# Patient Record
Sex: Female | Born: 1935 | Race: Black or African American | Hispanic: No
Health system: Southern US, Community
[De-identification: ages and names within clinical notes are randomized; demographics above are authoritative.]

## PROBLEM LIST (undated history)

## (undated) DIAGNOSIS — R4701 Aphasia: Secondary | ICD-10-CM

## (undated) DIAGNOSIS — M6281 Muscle weakness (generalized): Secondary | ICD-10-CM

## (undated) DIAGNOSIS — R569 Unspecified convulsions: Secondary | ICD-10-CM

## (undated) DIAGNOSIS — E785 Hyperlipidemia, unspecified: Secondary | ICD-10-CM

## (undated) DIAGNOSIS — R293 Abnormal posture: Secondary | ICD-10-CM

## (undated) DIAGNOSIS — R6251 Failure to thrive (child): Secondary | ICD-10-CM

## (undated) DIAGNOSIS — G8190 Hemiplegia, unspecified affecting unspecified side: Secondary | ICD-10-CM

## (undated) DIAGNOSIS — F0390 Unspecified dementia without behavioral disturbance: Secondary | ICD-10-CM

## (undated) DIAGNOSIS — F32A Depression, unspecified: Secondary | ICD-10-CM

## (undated) DIAGNOSIS — R5381 Other malaise: Secondary | ICD-10-CM

## (undated) DIAGNOSIS — F329 Major depressive disorder, single episode, unspecified: Secondary | ICD-10-CM

## (undated) DIAGNOSIS — R296 Repeated falls: Secondary | ICD-10-CM

## (undated) DIAGNOSIS — R262 Difficulty in walking, not elsewhere classified: Secondary | ICD-10-CM

## (undated) DIAGNOSIS — M24529 Contracture, unspecified elbow: Secondary | ICD-10-CM

## (undated) DIAGNOSIS — N289 Disorder of kidney and ureter, unspecified: Secondary | ICD-10-CM

## (undated) DIAGNOSIS — R279 Unspecified lack of coordination: Secondary | ICD-10-CM

## (undated) DIAGNOSIS — R1310 Dysphagia, unspecified: Secondary | ICD-10-CM

## (undated) DIAGNOSIS — M1990 Unspecified osteoarthritis, unspecified site: Secondary | ICD-10-CM

## (undated) DIAGNOSIS — K5900 Constipation, unspecified: Secondary | ICD-10-CM

## (undated) DIAGNOSIS — W19XXXA Unspecified fall, initial encounter: Secondary | ICD-10-CM

## (undated) DIAGNOSIS — I10 Essential (primary) hypertension: Secondary | ICD-10-CM

---

## 2016-11-12 ENCOUNTER — Emergency Department (HOSPITAL_COMMUNITY)
Admission: EM | Admit: 2016-11-12 | Discharge: 2016-11-12 | Disposition: A | Discharge status: Skilled Nursing Facility | Payer: Medicare Other | Attending: Emergency Medicine | Admitting: Emergency Medicine

## 2016-11-12 ENCOUNTER — Emergency Department (HOSPITAL_COMMUNITY): Payer: Medicare Other

## 2016-11-12 ENCOUNTER — Encounter (HOSPITAL_COMMUNITY): Payer: Self-pay | Admitting: Emergency Medicine

## 2016-11-12 DIAGNOSIS — Z431 Encounter for attention to gastrostomy: Secondary | ICD-10-CM | POA: Insufficient documentation

## 2016-11-12 DIAGNOSIS — R633 Feeding difficulties, unspecified: Secondary | ICD-10-CM

## 2016-11-12 DIAGNOSIS — R410 Disorientation, unspecified: Secondary | ICD-10-CM | POA: Diagnosis present

## 2016-11-12 DIAGNOSIS — F0390 Unspecified dementia without behavioral disturbance: Secondary | ICD-10-CM | POA: Diagnosis not present

## 2016-11-12 DIAGNOSIS — Z4659 Encounter for fitting and adjustment of other gastrointestinal appliance and device: Secondary | ICD-10-CM

## 2016-11-12 HISTORY — DX: Unspecified fall, initial encounter: W19.XXXA

## 2016-11-12 HISTORY — DX: Essential (primary) hypertension: I10

## 2016-11-12 HISTORY — DX: Dysphagia, unspecified: R13.10

## 2016-11-12 HISTORY — DX: Hemiplegia, unspecified affecting unspecified side: G81.90

## 2016-11-12 HISTORY — DX: Disorder of kidney and ureter, unspecified: N28.9

## 2016-11-12 HISTORY — DX: Constipation, unspecified: K59.00

## 2016-11-12 HISTORY — DX: Hyperlipidemia, unspecified: E78.5

## 2016-11-12 HISTORY — DX: Other malaise: R53.81

## 2016-11-12 HISTORY — DX: Unspecified convulsions: R56.9

## 2016-11-12 HISTORY — DX: Contracture, unspecified elbow: M24.529

## 2016-11-12 HISTORY — DX: Unspecified osteoarthritis, unspecified site: M19.90

## 2016-11-12 HISTORY — DX: Abnormal posture: R29.3

## 2016-11-12 HISTORY — DX: Difficulty in walking, not elsewhere classified: R26.2

## 2016-11-12 HISTORY — DX: Muscle weakness (generalized): M62.81

## 2016-11-12 HISTORY — DX: Depression, unspecified: F32.A

## 2016-11-12 HISTORY — DX: Aphasia: R47.01

## 2016-11-12 HISTORY — DX: Failure to thrive (child): R62.51

## 2016-11-12 HISTORY — DX: Major depressive disorder, single episode, unspecified: F32.9

## 2016-11-12 HISTORY — DX: Unspecified lack of coordination: R27.9

## 2016-11-12 HISTORY — DX: Unspecified dementia, unspecified severity, without behavioral disturbance, psychotic disturbance, mood disturbance, and anxiety: F03.90

## 2016-11-12 HISTORY — PX: IR REPLC GASTRO/COLONIC TUBE PERCUT W/FLUORO: IMG2333

## 2016-11-12 HISTORY — DX: Repeated falls: R29.6

## 2016-11-12 MED ORDER — IOPAMIDOL (ISOVUE-300) INJECTION 61%
INTRAVENOUS | Status: AC
  Filled 2016-11-12: qty 50

## 2016-11-12 NOTE — ED Provider Notes (Signed)
[facility]-EMERGENCY DEPT Provider Note   CSN: [id number] Arrival date & time: 11/12/16  4098     History   Chief Complaint Chief Complaint  Patient presents with  . peg tube removed by patient    HPI Tara Humphrey is a 83 y.o. female.  The history is provided by the patient and medical records (nursing spoke to nursing home).  Wound Check  This is a new problem. The current episode started 3 to 5 hours ago. The problem occurs constantly. The problem has not changed since onset.Pertinent negatives include no chest pain, no abdominal pain, no headaches and no shortness of breath.   LVL 5 Caveat for dementia  No past medical history on file.  There are no active problems to display for this patient.   No past surgical history on file.  OB History    No data available       Home Medications    Prior to Admission medications   Not on File    Family History No family history on file.  Social History Social History  Substance Use Topics  . Smoking status: Not on file  . Smokeless tobacco: Not on file  . Alcohol use Not on file     Allergies   Patient has no allergy information on record.   Review of Systems Review of Systems  Unable to perform ROS: Dementia  Constitutional: Negative for chills and fatigue.  Respiratory: Negative for chest tightness and shortness of breath.   Cardiovascular: Negative for chest pain.  Gastrointestinal: Negative for abdominal pain, diarrhea, nausea and vomiting.  Genitourinary: Negative for dysuria and flank pain.  Musculoskeletal: Negative for back pain, neck pain and neck stiffness.  Skin: Negative for rash.  Neurological: Negative for headaches.  Psychiatric/Behavioral: Negative for agitation.     Physical Exam Updated Vital Signs BP (!) 138/125 (BP Location: Left Arm)   Pulse 76   Temp (!) 97.4 F (36.3 C) (Oral)   Resp 18   SpO2 100%   Physical Exam  Constitutional: She appears well-developed and  well-nourished. No distress.  HENT:  Mouth/Throat: Oropharynx is clear and moist. No oropharyngeal exudate.  Eyes: Conjunctivae are normal. No scleral icterus.  Patient does not open her eyes well. After being pried open, patient's right eyehad reactive pupils. Left eye difficult to view the pupil.  Cardiovascular:  No murmur heard. Pulmonary/Chest: No stridor. No respiratory distress. She has no wheezes. She has no rales. She exhibits no tenderness.  Abdominal: Soft. Bowel sounds are normal. There is no tenderness.    Musculoskeletal: She exhibits no tenderness or deformity.  Neurological: She is alert.  Skin: Capillary refill takes less than 2 seconds. No rash noted. She is not diaphoretic.  Nursing note and vitals reviewed.    ED Treatments / Results  Labs (all labs ordered are listed, but only abnormal results are displayed) Labs Reviewed - No data to display  EKG  EKG Interpretation None       Radiology Ir Replc Gastro/colonic Tube Percut W/fluoro  Result Date: 11/12/2016 CLINICAL DATA:  Dislodged gastrostomy tube. EXAM: GASTROSTOMY CATHETER REPLACEMENT WITH FLUOROSCOPY Physician: Rachelle Hora. Lowella Dandy, MD FLUOROSCOPY TIME:  12 seconds, 1 mGy MEDICATIONS: None ANESTHESIA/SEDATION: Moderate sedation time: None CONTRAST:  10 mL Isovue-300 PROCEDURE: Gastrostomy tube was completely dislodged. A 5 French catheter was advanced through the old gastrostomy site and an Amplatz wire was placed. A 14 French Entuit gastrostomy tube was advanced over the wire. The balloon was inflated with 5 cc  of saline. Contrast injection confirmed placement in the stomach. Fluoroscopic images were taken and saved for this procedure. FINDINGS: New gastrostomy tube in the stomach. COMPLICATIONS: No immediate complication IMPRESSION: Successful replacement of the gastrostomy tube. The patient now has a 30 French balloon retention gastrostomy tube. Tube is ready for use. Electronically Signed   By: Richarda Overlie M.D.    On: 11/12/2016 13:37    Procedures Procedures (including critical care time)  Medications Ordered in ED Medications - No data to display   Initial Impression / Assessment and Plan / ED Course  I have reviewed the triage vital signs and the nursing notes.  Pertinent labs & imaging results that were available during my care of the patient were reviewed by me and considered in my medical decision making (see chart for details).     Tara Humphrey is a 83 y.o. female with a past medical history significant for vascular dementia, depression, dysphagia status post G-tube, hypertension, hyperlipidemia, seizures, GERD, and prior stroke who presents with G-tube problem. According to EMS, patient G-tube fell out overnight and patient was sent from [facility] to have it replaced. Patient is disoriented and does not know why she is here. He denies any abdominal pain, nausea, vomiting, chest pain, cough, shortness of breath or any history symptoms. Patient is pleasantly confused and in no distress.  On exam, patient has a large nodule on her left neck. Documentation that arrived with patient from EMS shows that patient has an ongoing workup of her left neck lump. Patient has clear lungs on exam. Nontender chest. Nontender abdomen. Patient has a G-tube site in her upper abdomen that appears crusted but no active drainage. Physical exam otherwise showed no acute evidence of trauma or other new abnormality.  Facility will be called for more information on type of tube present.  11:28 AM Nursing spoke to the patient's facility and they reported that patient had a feeding tube but did not know the type or size. They said that when he gets pulled out the usually place "a 12 for a 14" into the site. As we do not know what type of tube or catheter was previously used, we will attempt a Foley placement to keep the tract open. It is also uncertain how long the patient has had the catheter in place. Scarring makes it  appears that it is not fresh however on my exam, it appeared very crusted over and very tight.  An attempt was made with a ize 12 Foley catheter. Another attempt was made with an in and out small catheter to try and open the site. After several attempts failure, wound will be dressed and interventional radiology will be called for assistance.   Anticipate having IR help place a catheter and patient will be discharged to follow-up with her general surgeon for further management of her feeding tube.  IR sent a provider to the bedside and was able to place a 14 G-tube into the site. Placement was confirmed with x-ray. Interventional radiology had to use a wire guided placement.  Patient tolerated procedure without difficulty. Patient will be discharged back to her facility after  successful replacement by interventional radiology team. Did not feel patient had any other symptoms requiring evaluation or management today. Patient discharged back to facility in good condition. Patient given instructions to follow up with general surgery team that placed the tube for reassessment and further management.    Final Clinical Impressions(s) / ED Diagnoses   Final diagnoses:  Encounter for feeding tube placement    New Prescriptions There are no discharge medications for this patient.   Clinical Impression: 1. Encounter for feeding tube placement   2. Feeding difficulties     Disposition: Discharge  Condition: Good  I have discussed the results, Dx and Tx plan with the pt(& family if present). He/she/they expressed understanding and agree(s) with the plan. Discharge instructions discussed at great length. Strict return precautions discussed and pt &/or family have verbalized understanding of the instructions. No further questions at time of discharge.    There are no discharge medications for this patient.   Follow Up: [facility] [address] [id number] mc [place] [address] [phone]  If symptoms worsen     Tegeler, Canary Brim, MD 11/12/16 [phone]

## 2016-11-12 NOTE — ED Notes (Signed)
Deborah from [facility] regarding pt gastrostomy tube displacement. Deborah verbalizes tube size 12 or 14 and pulled out this am. Tegeler, MD notified of the above face to face interaction.

## 2016-11-12 NOTE — ED Notes (Signed)
Bed: AT55 Expected date:  Expected time:  Means of arrival:  Comments: Ems-pulled out feeding tube

## 2016-11-12 NOTE — Discharge Instructions (Signed)
Your feeding tube was replaced by the interventional radiology team today. Please follow-up with your primary care physician and/or surgeon for further management of your feeding tube. As you have had no other complaints, we feel comfortable letting you go back to your facility with your new tube in place. Please be careful not to pull it out again. If any symptoms change or worsen, please return to the nearest emergency department.

## 2016-11-12 NOTE — ED Triage Notes (Signed)
Pt arrives to [facility] from [facility] via [facility] because she removed her peg tube around 6am today. Pt has history of dementia.

## 2016-11-12 NOTE — ED Notes (Signed)
Tegeler, MD attempt to place 14 french foley until pt able to follow up with GI; unsuccessful attempt; plan to consult to IR for gastrostomy tube placement.

## 2016-11-27 ENCOUNTER — Encounter (HOSPITAL_COMMUNITY): Payer: Self-pay | Admitting: Emergency Medicine

## 2016-11-27 ENCOUNTER — Emergency Department (HOSPITAL_COMMUNITY)
Admission: EM | Admit: 2016-11-27 | Discharge: 2016-11-27 | Disposition: A | Discharge status: Home / Self Care | Payer: Medicare Other | Attending: Emergency Medicine | Admitting: Emergency Medicine

## 2016-11-27 ENCOUNTER — Emergency Department (HOSPITAL_COMMUNITY): Payer: Medicare Other

## 2016-11-27 DIAGNOSIS — E870 Hyperosmolality and hypernatremia: Secondary | ICD-10-CM

## 2016-11-27 DIAGNOSIS — I10 Essential (primary) hypertension: Secondary | ICD-10-CM | POA: Insufficient documentation

## 2016-11-27 DIAGNOSIS — S06370A Contusion, laceration, and hemorrhage of cerebellum without loss of consciousness, initial encounter: Secondary | ICD-10-CM | POA: Diagnosis not present

## 2016-11-27 DIAGNOSIS — S0990XA Unspecified injury of head, initial encounter: Secondary | ICD-10-CM | POA: Diagnosis present

## 2016-11-27 DIAGNOSIS — Y92129 Unspecified place in nursing home as the place of occurrence of the external cause: Secondary | ICD-10-CM | POA: Insufficient documentation

## 2016-11-27 DIAGNOSIS — F0390 Unspecified dementia without behavioral disturbance: Secondary | ICD-10-CM | POA: Diagnosis not present

## 2016-11-27 DIAGNOSIS — S06339A Contusion and laceration of cerebrum, unspecified, with loss of consciousness of unspecified duration, initial encounter: Secondary | ICD-10-CM

## 2016-11-27 DIAGNOSIS — W19XXXA Unspecified fall, initial encounter: Secondary | ICD-10-CM | POA: Insufficient documentation

## 2016-11-27 DIAGNOSIS — S0633AA Contusion and laceration of cerebrum, unspecified, with loss of consciousness status unknown, initial encounter: Secondary | ICD-10-CM

## 2016-11-27 DIAGNOSIS — T148XXA Other injury of unspecified body region, initial encounter: Secondary | ICD-10-CM

## 2016-11-27 DIAGNOSIS — Y999 Unspecified external cause status: Secondary | ICD-10-CM | POA: Insufficient documentation

## 2016-11-27 DIAGNOSIS — S0083XA Contusion of other part of head, initial encounter: Secondary | ICD-10-CM | POA: Diagnosis not present

## 2016-11-27 DIAGNOSIS — Y939 Activity, unspecified: Secondary | ICD-10-CM | POA: Diagnosis not present

## 2016-11-27 LAB — CBC WITH DIFFERENTIAL/PLATELET
BASOS ABS: 0 10*3/uL (ref 0.0–0.1)
BASOS PCT: 0 %
Eosinophils Absolute: 0.1 10*3/uL (ref 0.0–0.7)
Eosinophils Relative: 1 %
HEMATOCRIT: 35.8 % — AB (ref 36.0–46.0)
HEMOGLOBIN: 11.2 g/dL — AB (ref 12.0–15.0)
Lymphocytes Relative: 17 %
Lymphs Abs: 2 10*3/uL (ref 0.7–4.0)
MCH: 27.5 pg (ref 26.0–34.0)
MCHC: 31.3 g/dL (ref 30.0–36.0)
MCV: 88 fL (ref 78.0–100.0)
MONOS PCT: 9 %
Monocytes Absolute: 1.1 10*3/uL — ABNORMAL HIGH (ref 0.1–1.0)
NEUTROS ABS: 8.6 10*3/uL — AB (ref 1.7–7.7)
NEUTROS PCT: 73 %
Platelets: 145 10*3/uL — ABNORMAL LOW (ref 150–400)
RBC: 4.07 MIL/uL (ref 3.87–5.11)
RDW: 19.7 % — ABNORMAL HIGH (ref 11.5–15.5)
WBC: 11.9 10*3/uL — AB (ref 4.0–10.5)

## 2016-11-27 LAB — BASIC METABOLIC PANEL
ANION GAP: 10 (ref 5–15)
BUN: 37 mg/dL — ABNORMAL HIGH (ref 6–20)
CALCIUM: 10.1 mg/dL (ref 8.9–10.3)
CO2: 26 mmol/L (ref 22–32)
Chloride: 116 mmol/L — ABNORMAL HIGH (ref 101–111)
Creatinine, Ser: 0.85 mg/dL (ref 0.44–1.00)
GFR calc non Af Amer: 57 mL/min — ABNORMAL LOW (ref >60)
Glucose, Bld: 93 mg/dL (ref 65–99)
Potassium: 4.6 mmol/L (ref 3.5–5.1)
Sodium: 152 mmol/L — ABNORMAL HIGH (ref 135–145)

## 2016-11-27 LAB — CK: Total CK: 116 U/L (ref 38–234)

## 2016-11-27 MED ORDER — SODIUM CHLORIDE 0.9 % IV BOLUS (SEPSIS)
500.0000 mL | Freq: Once | INTRAVENOUS | Status: AC
  Administered 2016-11-27: 500 mL via INTRAVENOUS

## 2016-11-27 NOTE — ED Notes (Signed)
Report called to [facility] to Argentina Donovan, Charity fundraiser.  [facility] called for patient pick up. No eta at this time. Patient PIV removed.

## 2016-11-27 NOTE — ED Notes (Signed)
Patient transported home via [facility]  

## 2016-11-27 NOTE — ED Notes (Signed)
Unable to obtain IV access nor lab work. Attempts x 3. 2 RNs

## 2016-11-27 NOTE — ED Triage Notes (Signed)
Per [facility] pt from [facility] facility, fall today , unwitnessed, dementia , right forehead hematoma, oriented to self only. Reports headache. No obvious injury to extremities per [facility].

## 2016-11-27 NOTE — Discharge Instructions (Addendum)
The patient has a tiny bleed in her head. This is insignificant and should self resolve without any interventions. Apply ice to the contusions on her head. Tylenol for pain. I discussed the findings with her son who is the health care power of attorney.

## 2016-11-27 NOTE — ED Provider Notes (Signed)
[facility]-EMERGENCY DEPT Provider Note   CSN: [id number] Arrival date & time: 11/27/16  9147     History   Chief Complaint Chief Complaint  Patient presents with  . Fall    HPI Tara Humphrey is a 83 y.o. female brought in by EMS for unwitnessed fall at her nursing home. She was found on the floor for unknown amount of time with large hematoma to her forehead. Patient is displaced from [place] and is originally from [facility] she's a history of a facial, hemiparesis, chronic contracture and dementia. She is currently at her baseline mental status. HPI  Past Medical History:  Diagnosis Date  . Abnormal posture   . Aphasia   . Arthritis   . Constipation   . Contracture, elbow   . Dementia   . Depression   . Difficulty walking   . Dysphagia   . Failure to thrive (0-17)   . Falls   . Hemiplegia (HCC)   . Hyperlipemia   . Hypertension   . Lack of coordination   . Malaise   . Muscle weakness   . Renal disorder   . Unspecified convulsions (HCC)     There are no active problems to display for this patient.   Past Surgical History:  Procedure Laterality Date  . IR REPLC GASTRO/COLONIC TUBE PERCUT W/FLUORO  11/12/2016    OB History    No data available       Home Medications    Prior to Admission medications   Not on File    Family History No family history on file.  Social History Social History  Substance Use Topics  . Smoking status: Unknown If Ever Smoked  . Smokeless tobacco: Not on file  . Alcohol use No     Allergies   Tramadol   Review of Systems Review of Systems Unable to review systems due to dementia  Physical Exam Updated Vital Signs BP (!) 142/103 (BP Location: Right Arm)   Pulse 81   Temp 97.9 F (36.6 C) (Oral)   Resp 17   SpO2 96%   Physical Exam  Constitutional: She is oriented to person, place, and time. She appears well-developed and well-nourished. No distress.  HENT:  Head:  Normocephalic and atraumatic.  Eyes:  Both eyes are matted. There is discharge from the left eye with an entropion and conjunctival injection with large thickened cataract.  Neck: Normal range of motion.  Cardiovascular: Normal rate, regular rhythm and normal heart sounds.  Exam reveals no gallop and no friction rub.   No murmur heard. Pulmonary/Chest: Effort normal and breath sounds normal. No respiratory distress.  Abdominal: Soft. Bowel sounds are normal. She exhibits no distension and no mass. There is no tenderness. There is no guarding.  Neurological: She is alert and oriented to person, place, and time.  Skin: Skin is warm and dry. She is not diaphoretic.  Psychiatric: Her behavior is normal.  Nursing note and vitals reviewed.    ED Treatments / Results  Labs (all labs ordered are listed, but only abnormal results are displayed) Labs Reviewed - No data to display  EKG  EKG Interpretation None       Radiology No results found.  Procedures Procedures (including critical care time)  Medications Ordered in ED Medications - No data to display   Initial Impression / Assessment and Plan / ED Course  I have reviewed the triage vital signs and the nursing notes.  Pertinent labs & imaging results  that were available during my care of the patient were reviewed by me and considered in my medical decision making (see chart for details).  Clinical Course as of Nov 28 1606  Tue Nov 27, 2016  1547 Patient with a tiny right-sided hemorrhagic contusion after unwitnessed fall. Labs are reviewed without significant abnormality. I discussed the findings with Dr. Reita May of neurosurgery who states that there is nothing to do about that and that should self resolve without any significant changes in her mental status. I discussed the case with her son who is the healthcare power of attorney he understands the diagnosis. Patient appears safe to be discharged back to her facility.  [AH]      Clinical Course User Index [AH] Arthor Captain, PA-C    Patient's hyponatremia and treat it with 500 mL bolus of fluid given via her PEG tube. Patient needs close follow-up with her PCP for recheck.  Final Clinical Impressions(s) / ED Diagnoses   Final diagnoses:  Fall, initial encounter  Hematoma and contusion  Focal hemorrhagic contusion of cerebrum (HCC)  Hypernatremia    New Prescriptions New Prescriptions   No medications on file     Arthor Captain, PA-C 11/27/16 1609    Rolland Porter, MD 12/09/16 [phone]

## 2016-11-27 NOTE — ED Notes (Signed)
Bed: WA11 Expected date:  Expected time:  Means of arrival:  Comments: EMS 

## 2016-11-27 NOTE — ED Notes (Signed)
IV team unable to obtain blood sample/.

## 2017-11-06 IMAGING — CT CT CERVICAL SPINE W/O CM
4 of 8 series · 14 of 33 positions shown, 15 images · non-contrast
Comparison: None.

CLINICAL DATA: [AGE] female post fall. Hematoma right
forehead. Initial encounter.

EXAM:
CT HEAD WITHOUT CONTRAST
CT CERVICAL SPINE WITHOUT CONTRAST
TECHNIQUE: Multidetector CT imaging of the head and cervical spine was
performed following the standard protocol without intravenous
contrast. Multiplanar CT image reconstructions of the cervical spine
were also generated.

[Series 3: c-spine st · axial · 0.38mm/px · z∈[+1356,+1404]mm · 2 of 94 slices shown]
[im 24/94  bone]
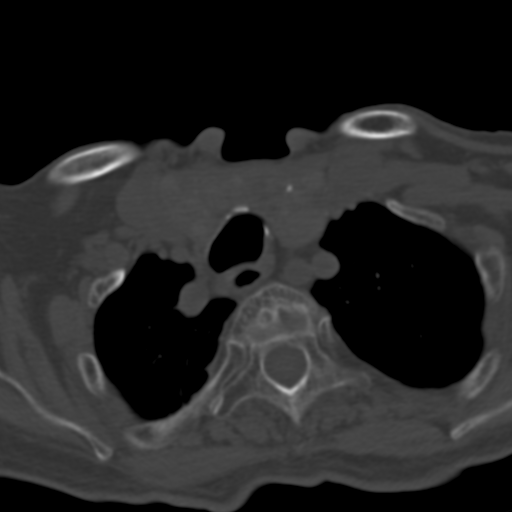
[im 47/94  bone]
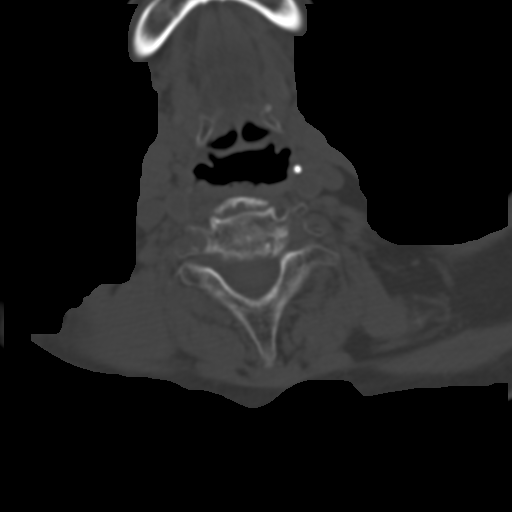

[Series 9: coronal · coronal · 0.30mm/px · 3 of 74 slices shown]
[im 19/74  bone]
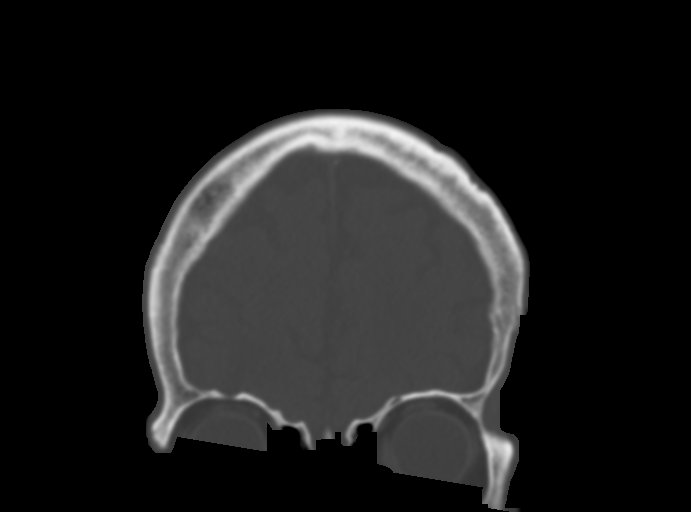
[im 37/74  bone]
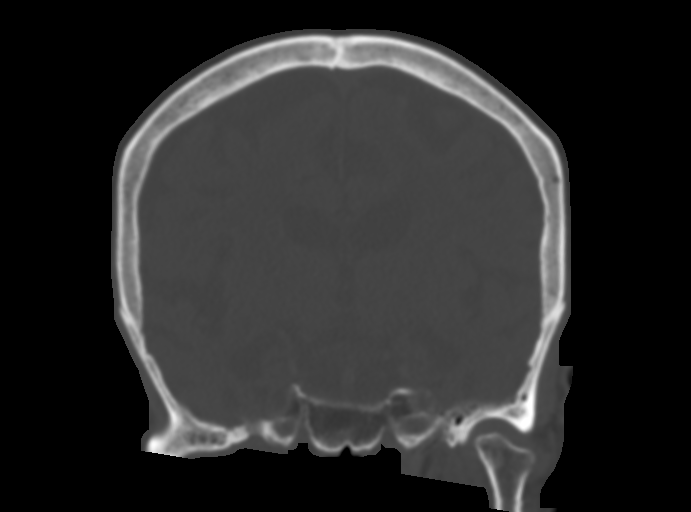
[im 55/74  bone]
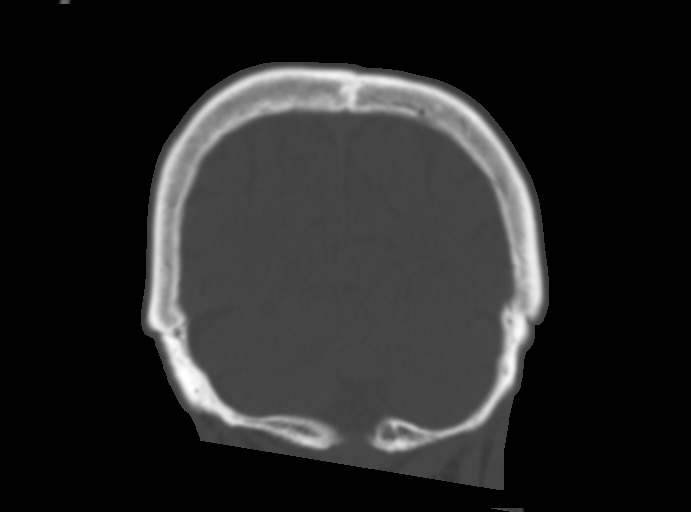

[Series 10: sagittal · sagittal · 0.29mm/px · 5 of 71 slices shown]
[im 12/71  bone]
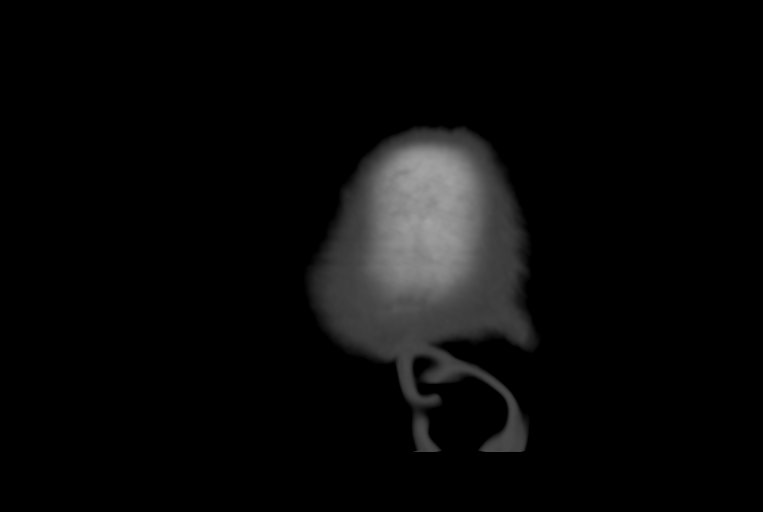
[im 24/71  bone]
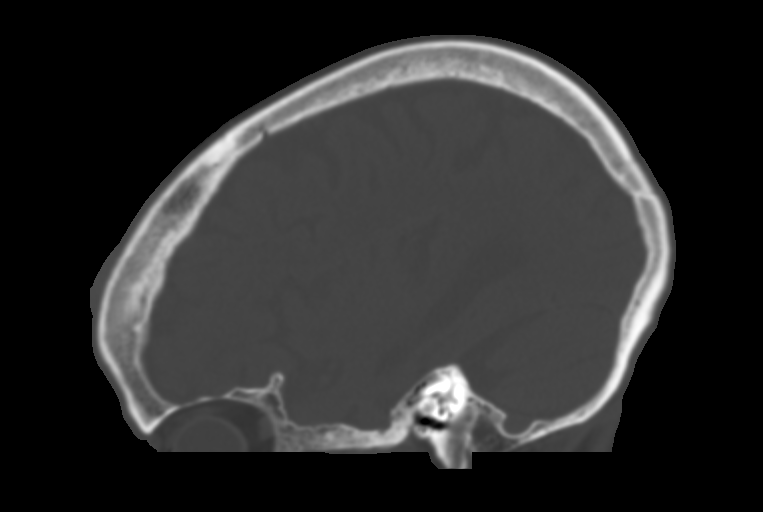
[im 36/71  bone]
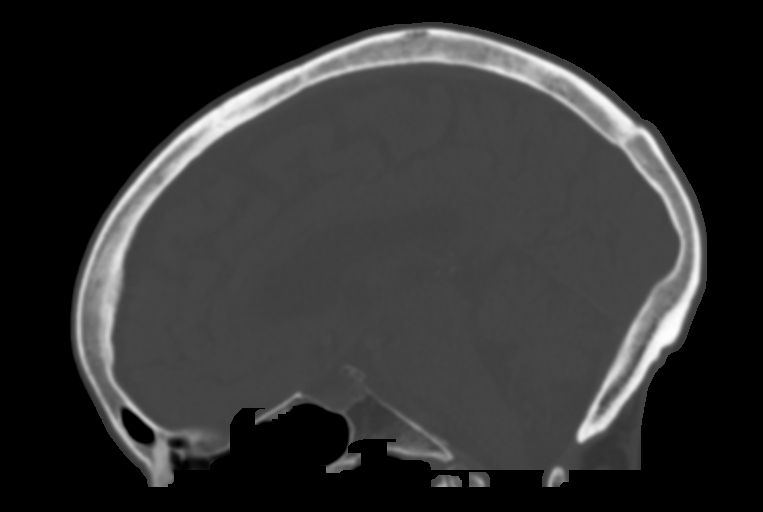
[im 47/71  bone]
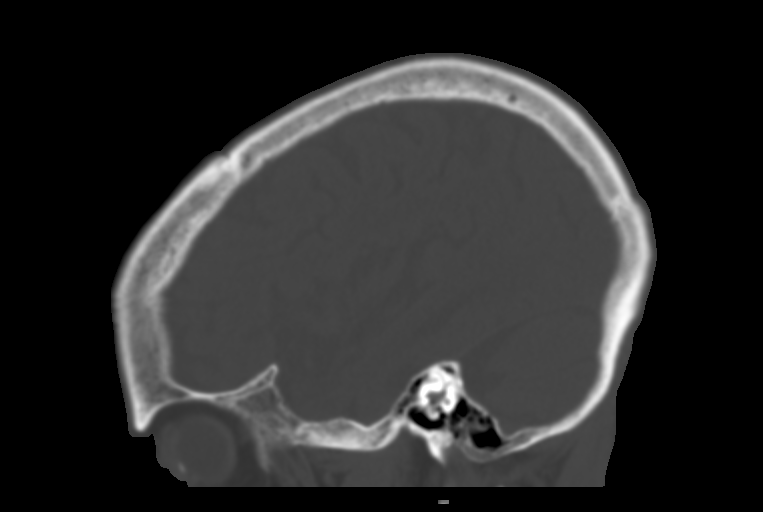
[im 59/71  bone]
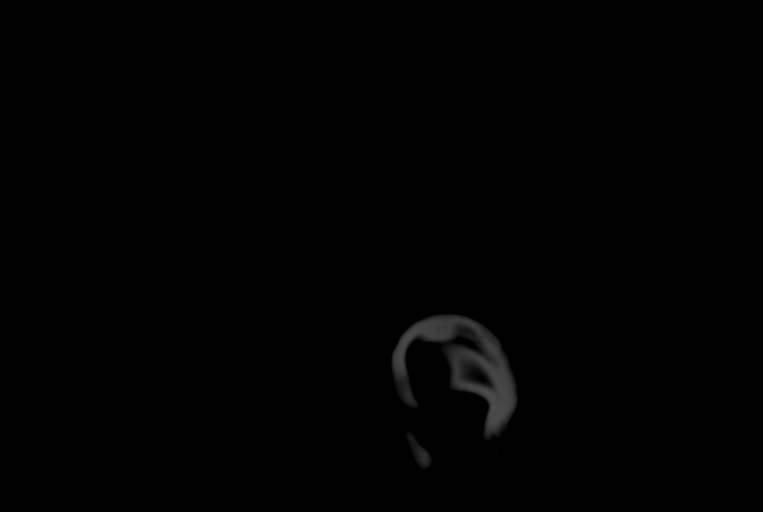

[Series 11: axial recon · axial · 0.23mm/px · z∈[+1313,+1431]mm · 4 of 112 slices shown, 5 images]
[im 23/112  soft-tissue]
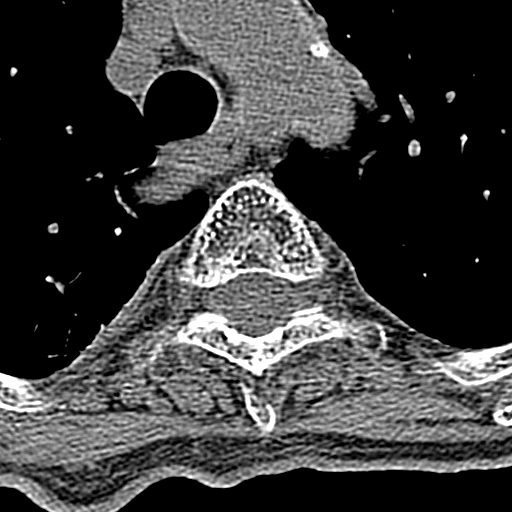
[im 23/112  bone]
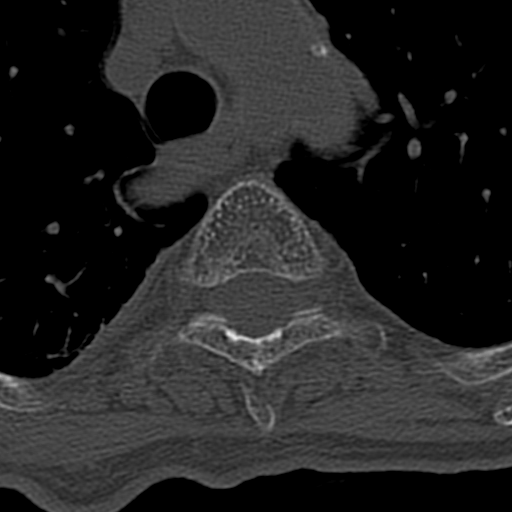
[im 45/112  bone]
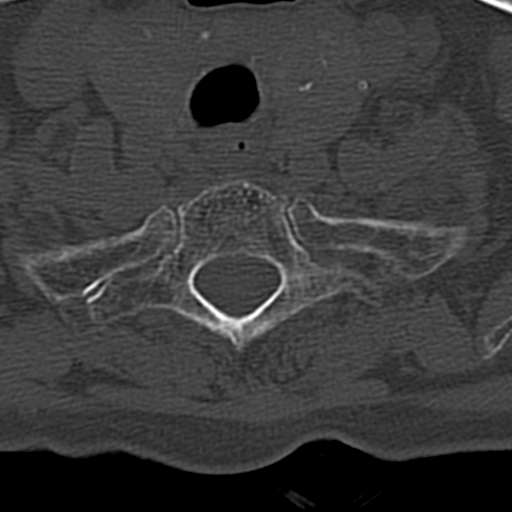
[im 67/112  bone]
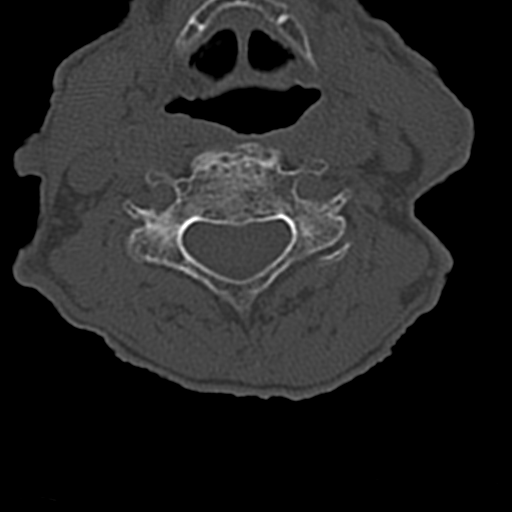
[im 89/112  bone]
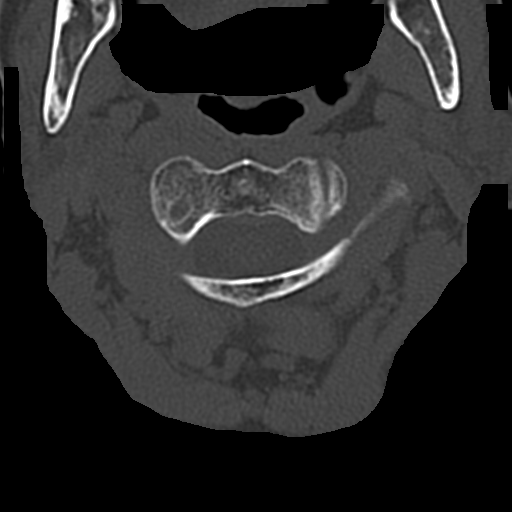

[14 of 33 positions shown; findings below may reference images not displayed]

FINDINGS: CT HEAD FINDINGS

Brain: Tiny right frontal hemorrhagic contusion suspected (series 9,
image 24 and series 10, image 29).

Probable streak artifact anterior medial right frontal lobe (series
6, image 13, series 9, image 14 and series 10, image 37). Probable
artifact posterior right frontal lobe (series 10, image 24) and
posterior left frontal lobe (series 10, image 44).

Chronic microvascular changes without CT evidence of large acute
infarct.

Global atrophy.

No intracranial mass lesion noted on this unenhanced exam.

Vascular: Vascular calcifications.

Skull: No skull fracture.

Sinuses/Orbits: No acute orbital abnormality. Visualized paranasal
sinuses clear.

Other: Anterior right frontal scalp hematoma.

CT CERVICAL SPINE FINDINGS

Alignment: No traumatic malalignment.

Skull base and vertebrae: No cervical spine fracture.

Soft tissues and spinal canal: No abnormal prevertebral soft tissue
swelling.

Disc levels: Cervical spondylotic changes with spinal stenosis and
slight cord flattening most notable C3-4 level.

Upper chest: No worrisome lung lesion

Other: Enlarged heterogeneous thyroid gland suggestive of
multinodular goiter.

Vascular calcifications including carotid bifurcation calcification.
IMPRESSION: CT HEAD

Tiny right frontal hemorrhagic contusion suspected (series 9, image
24 and series 10, image 29)

Anterior right frontal scalp hematoma without underlying skull
fracture.

Chronic microvascular changes without CT evidence of large acute
infarct.

Global atrophy.

CT CERVICAL SPINE

No cervical spine fracture, malalignment or abnormal prevertebral
soft tissue swelling.

Cervical spondylotic changes with spinal stenosis and slight cord
flattening most notable C3-4 level.

Enlarged heterogeneous thyroid gland suggestive of multinodular
goiter.

These results were called by telephone at the time of interpretation
acknowledged these results.

## 2018-02-19 DEATH — deceased
